# Patient Record
Sex: Male | Born: 1995 | Race: Black or African American | Hispanic: No | Marital: Single | Smoking: Never smoker
Health system: Southern US, Community
[De-identification: ages and names within clinical notes are randomized; demographics above are authoritative.]

## PROBLEM LIST (undated history)

## (undated) HISTORY — PX: ABDOMINAL SURGERY: SHX537

---
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
## 2017-08-16 ENCOUNTER — Other Ambulatory Visit: Payer: Self-pay

## 2017-08-16 ENCOUNTER — Encounter: Payer: Self-pay | Admitting: Emergency Medicine

## 2017-08-16 DIAGNOSIS — S161XXA Strain of muscle, fascia and tendon at neck level, initial encounter: Secondary | ICD-10-CM | POA: Diagnosis not present

## 2017-08-16 DIAGNOSIS — S199XXA Unspecified injury of neck, initial encounter: Secondary | ICD-10-CM | POA: Diagnosis present

## 2017-08-16 DIAGNOSIS — Y9389 Activity, other specified: Secondary | ICD-10-CM | POA: Diagnosis not present

## 2017-08-16 DIAGNOSIS — Y999 Unspecified external cause status: Secondary | ICD-10-CM | POA: Diagnosis not present

## 2017-08-16 DIAGNOSIS — Y92410 Unspecified street and highway as the place of occurrence of the external cause: Secondary | ICD-10-CM | POA: Insufficient documentation
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
## 2017-08-17 ENCOUNTER — Emergency Department
Admission: EM | Admit: 2017-08-17 | Discharge: 2017-08-17 | Disposition: A | Discharge status: Home / Self Care | Payer: 59 | Attending: Emergency Medicine | Admitting: Emergency Medicine

## 2017-08-17 DIAGNOSIS — S161XXA Strain of muscle, fascia and tendon at neck level, initial encounter: Secondary | ICD-10-CM

## 2017-08-17 MED ORDER — CYCLOBENZAPRINE HCL 10 MG PO TABS
5.0000 mg | ORAL_TABLET | Freq: Once | ORAL | Status: AC
  Administered 2017-08-17: 5 mg via ORAL
  Filled 2017-08-17: qty 1

## 2017-08-17 MED ORDER — IBUPROFEN 800 MG PO TABS
800.0000 mg | ORAL_TABLET | Freq: Once | ORAL | Status: AC
  Administered 2017-08-17: 800 mg via ORAL
  Filled 2017-08-17: qty 1

## 2017-08-17 MED ORDER — IBUPROFEN 800 MG PO TABS: 800.0000 mg | ORAL_TABLET | Freq: Three times a day (TID) | ORAL | 0 refills | Status: AC | PRN

## 2017-08-17 MED ORDER — CYCLOBENZAPRINE HCL 5 MG PO TABS: ORAL_TABLET | ORAL | 0 refills | Status: AC
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
## 2017-08-17 NOTE — Discharge Instructions (Addendum)
1.  You may take medicines as needed for pain and muscle spasms (Motrin/Flexeril #15). °2.  Apply moist heat to affected area several times daily. °3.  Return to the ER for worsening symptoms, persistent vomiting, difficulty breathing or other concerns. °

## 2017-09-22 ENCOUNTER — Emergency Department: Payer: 59

## 2017-09-22 ENCOUNTER — Emergency Department
Admission: EM | Admit: 2017-09-22 | Discharge: 2017-09-22 | Disposition: A | Discharge status: Other Acute Inpatient Hospital | Payer: 59 | Attending: Student in an Organized Health Care Education/Training Program | Admitting: Student in an Organized Health Care Education/Training Program

## 2017-09-22 ENCOUNTER — Encounter: Payer: Self-pay | Admitting: Emergency Medicine

## 2017-09-22 DIAGNOSIS — Y939 Activity, unspecified: Secondary | ICD-10-CM | POA: Insufficient documentation

## 2017-09-22 DIAGNOSIS — Y999 Unspecified external cause status: Secondary | ICD-10-CM | POA: Insufficient documentation

## 2017-09-22 DIAGNOSIS — Z23 Encounter for immunization: Secondary | ICD-10-CM | POA: Diagnosis not present

## 2017-09-22 DIAGNOSIS — Y929 Unspecified place or not applicable: Secondary | ICD-10-CM | POA: Diagnosis not present

## 2017-09-22 DIAGNOSIS — W3400XA Accidental discharge from unspecified firearms or gun, initial encounter: Secondary | ICD-10-CM

## 2017-09-22 DIAGNOSIS — S3981XA Other specified injuries of abdomen, initial encounter: Secondary | ICD-10-CM | POA: Diagnosis present

## 2017-09-22 DIAGNOSIS — S31144A Puncture wound of abdominal wall with foreign body, left lower quadrant without penetration into peritoneal cavity, initial encounter: Secondary | ICD-10-CM | POA: Diagnosis not present

## 2017-09-22 DIAGNOSIS — R1032 Left lower quadrant pain: Secondary | ICD-10-CM

## 2017-09-22 MED ORDER — TETANUS-DIPHTH-ACELL PERTUSSIS 5-2.5-18.5 LF-MCG/0.5 IM SUSP
0.5000 mL | Freq: Once | INTRAMUSCULAR | Status: AC
  Administered 2017-09-22: 0.5 mL via INTRAMUSCULAR
  Filled 2017-09-22: qty 0.5

## 2017-09-22 MED ORDER — TRANEXAMIC ACID 1000 MG/10ML IV SOLN: 500.0000 mg | Freq: Once | INTRAVENOUS | Status: DC

## 2017-09-22 MED ORDER — TRANEXAMIC ACID 1000 MG/10ML IV SOLN
1000.0000 mg | INTRAVENOUS | Status: AC
  Administered 2017-09-22: 1000 mg via INTRAVENOUS
  Filled 2017-09-22: qty 10

## 2017-09-22 MED ORDER — TRANEXAMIC ACID 1000 MG/10ML IV SOLN
1000.0000 mg | Freq: Once | INTRAVENOUS | Status: DC
  Filled 2017-09-22: qty 10

## 2017-09-22 MED ORDER — FENTANYL CITRATE (PF) 100 MCG/2ML IJ SOLN
50.0000 ug | INTRAMUSCULAR | Status: DC | PRN
  Administered 2017-09-22: 50 ug via INTRAVENOUS
  Filled 2017-09-22: qty 2

## 2017-09-22 MED ORDER — SODIUM CHLORIDE 0.9 % IV SOLN
10.0000 mL/h | Freq: Once | INTRAVENOUS | Status: AC
  Administered 2017-09-22: 10 mL/h via INTRAVENOUS

## 2017-09-22 NOTE — ED Notes (Signed)
Chaplin at bedside with pt.

## 2017-09-22 NOTE — ED Notes (Signed)
2nd
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
## 2017-09-22 NOTE — ED Notes (Addendum)
Pt alert and talking prior to discharge from hospital. Iv fluids infusing.  PRBC's still infusing.  Pt placed on 2 liters oxygen via [place].  Sinus on monitor.  Bleeding controlled with gauze bandages.
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
## 2017-09-22 NOTE — ED Notes (Signed)
EMTALA REVIEWED 

## 2017-09-22 NOTE — ED Notes (Addendum)
Patient unable to sign for consent for transfer.  Family arrived as patient was being transferred.
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
## 2017-09-22 NOTE — ED Notes (Signed)
[facility].

## 2017-09-22 NOTE — ED Notes (Signed)
PTS CLOTHING REMOVED BY THIS RN AND AMY, RN. NO CUTTING INVOLVED IN REMOVAL. ITEMS REMOVED INCLUDE: 1 BLACK SHIRT, 2 GRAY SOCKS, 1 BLUE WITH RED STRIP SHORT, 1 BLACK UNDERWEAR, 2 BLACK AND WHITE STRIP PAIR OF SHOES. ALL ITEMS PLACED IN BELONGINGS BAG AND PLACED INTO BROWN PAPER BAG.
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
## 2017-09-22 NOTE — ED Notes (Signed)
Patient's clothing given to [employer] officer T. Greggory StallionGeorge.  Pt wearing his a golden colored chain around his neck with a golden color nuggett.

## 2017-09-22 NOTE — ED Notes (Signed)
ED Provider at bedside. 

## 2017-09-23 LAB — BPAM RBC
BLOOD PRODUCT EXPIRATION DATE: 201909102359
Blood Product Expiration Date: 201909102359
ISSUE DATE / TIME: 201908262131
ISSUE DATE / TIME: 201908262131
UNIT TYPE AND RH: 5100
Unit Type and Rh: 5100

## 2017-09-23 LAB — TYPE AND SCREEN
ABO/RH(D): O POS
ANTIBODY SCREEN: NEGATIVE
Unit division: 0
Unit division: 0

## 2017-09-25 LAB — PREPARE RBC (CROSSMATCH)

## 2018-08-06 ENCOUNTER — Other Ambulatory Visit: Payer: Self-pay | Admitting: *Deleted

## 2018-08-06 DIAGNOSIS — Z20822 Contact with and (suspected) exposure to covid-19: Secondary | ICD-10-CM

## 2018-08-11 LAB — NOVEL CORONAVIRUS, NAA: SARS-CoV-2, NAA: NOT DETECTED
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
## 2019-12-02 ENCOUNTER — Ambulatory Visit
Admission: EM | Admit: 2019-12-02 | Discharge: 2019-12-02 | Disposition: A | Discharge status: Home / Self Care | Payer: BC Managed Care – PPO

## 2019-12-02 DIAGNOSIS — S6992XD Unspecified injury of left wrist, hand and finger(s), subsequent encounter: Secondary | ICD-10-CM

## 2019-12-02 NOTE — ED Triage Notes (Signed)
Pt presents with complaints of left pinky finger injury. Reports he crushed it at work 4 months ago. Reports he never had it x-ray'd. He did go see his doctor at that time. The finger is swollen and painful still. Full range of motion in hand and finger.

## 2019-12-02 NOTE — ED Provider Notes (Signed)
[facility]    CSN: [id number] Arrival date & time: 12/02/19  1341      History   Chief Complaint Chief Complaint  Patient presents with  . Finger Injury    HPI Larry Hall is a 24 y.o. male  Presenting for, left pinky deformity.  Had crush injury 4 months ago at work.  Never had x-rayed, though states his "regular doctor gave me swelling".  Denies pain, numbness, weakness, decreased ROM.  History reviewed. No pertinent past medical history.  There are no problems to display for this patient.   Past Surgical History:  Procedure Laterality Date  . ABDOMINAL SURGERY         Home Medications    Prior to Admission medications   Medication Sig Start Date End Date Taking? Authorizing Provider  cyclobenzaprine (FLEXERIL) 5 MG tablet 1 tablet every 8 hours as needed for muscle spasms 08/17/17   Irean Hong, MD  ibuprofen (ADVIL,MOTRIN) 800 MG tablet Take 1 tablet (800 mg total) by mouth every 8 (eight) hours as needed for moderate pain. 08/17/17   Irean Hong, MD    Family History Family History  Problem Relation Age of Onset  . Healthy Mother   . Healthy Father     Social History Social History   Tobacco Use  . Smoking status: Never Smoker  . Smokeless tobacco: Never Used  Substance Use Topics  . Alcohol use: No  . Drug use: Never     Allergies   Patient has no known allergies.   Review of Systems Review of Systems  Constitutional: Negative for fatigue and fever.  Respiratory: Negative for cough and shortness of breath.   Cardiovascular: Negative for chest pain and palpitations.  Musculoskeletal: Positive for joint swelling.  Neurological: Negative for weakness and numbness.     Physical Exam Triage Vital Signs ED Triage Vitals  Enc Vitals Group     BP 12/02/19 1421 119/69     Pulse Rate 12/02/19 1421 (!) 56     Resp 12/02/19 1421 19     Temp 12/02/19 1421 98.3 F (36.8 C)     Temp src --      SpO2 12/02/19 1421 98 %      Weight --      Height --      Head Circumference --      Peak Flow --      Pain Score 12/02/19 1420 7     Pain Loc --      Pain Edu? --      Excl. in GC? --    No data found.  Updated Vital Signs BP 119/69   Pulse (!) 56   Temp 98.3 F (36.8 C)   Resp 19   SpO2 98%   Visual Acuity Right Eye Distance:   Left Eye Distance:   Bilateral Distance:    Right Eye Near:   Left Eye Near:    Bilateral Near:     Physical Exam Constitutional:      General: He is not in acute distress. HENT:     Head: Normocephalic and atraumatic.  Eyes:     General: No scleral icterus.    Pupils: Pupils are equal, round, and reactive to light.  Cardiovascular:     Rate and Rhythm: Normal rate.  Pulmonary:     Effort: Pulmonary effort is normal. No respiratory distress.     Breath sounds: No wheezing.  Musculoskeletal:  General: Deformity present. No swelling. Normal range of motion.     Comments: Slight flexion left fifth digit, DIP.  NVI  Skin:    Capillary Refill: Capillary refill takes less than 2 seconds.     Coloration: Skin is not jaundiced or pale.  Neurological:     General: No focal deficit present.     Mental Status: He is alert and oriented to person, place, and time.      UC Treatments / Results  Labs (all labs ordered are listed, but only abnormal results are displayed) Labs Reviewed - No data to display  EKG   Radiology No results found.  Procedures Procedures (including critical care time)  Medications Ordered in UC Medications - No data to display  Initial Impression / Assessment and Plan / UC Course  I have reviewed the triage vital signs and the nursing notes.  Pertinent labs & imaging results that were available during my care of the patient were reviewed by me and considered in my medical decision making (see chart for details).     Injury occurred 4 months ago, reviewed that wound has already healed.  Will defer x-ray at this time, provided  Ortho contact information should patient want to pursue imaging, deformity revision as this is not currently impacting QOL.  Return precautions discussed, pt verbalized understanding and is agreeable to plan. Final Clinical Impressions(s) / UC Diagnoses   Final diagnoses:  Finger injury, left, subsequent encounter   Discharge Instructions   None    ED Prescriptions    None     PDMP not reviewed this encounter.   Hall-Potvin, [place], [place] 12/02/19 1439

## 2021-02-27 ENCOUNTER — Ambulatory Visit: Payer: BC Managed Care – PPO

## 2023-06-15 ENCOUNTER — Emergency Department (HOSPITAL_BASED_OUTPATIENT_CLINIC_OR_DEPARTMENT_OTHER)
Admission: EM | Admit: 2023-06-15 | Discharge: 2023-06-16 | Disposition: A | Discharge status: Home / Self Care | Payer: Self-pay | Attending: Emergency Medicine | Admitting: Emergency Medicine

## 2023-06-15 ENCOUNTER — Other Ambulatory Visit: Payer: Self-pay

## 2023-06-15 ENCOUNTER — Encounter (HOSPITAL_BASED_OUTPATIENT_CLINIC_OR_DEPARTMENT_OTHER): Payer: Self-pay | Admitting: Emergency Medicine

## 2023-06-15 DIAGNOSIS — N4889 Other specified disorders of penis: Secondary | ICD-10-CM | POA: Insufficient documentation

## 2023-06-15 DIAGNOSIS — S39840A Fracture of corpus cavernosum penis, initial encounter: Secondary | ICD-10-CM

## 2023-06-15 DIAGNOSIS — X501XXA Overexertion from prolonged static or awkward postures, initial encounter: Secondary | ICD-10-CM | POA: Diagnosis not present

## 2023-06-15 NOTE — ED Triage Notes (Signed)
 Pt reports he was having sex Friday night & he hit his penis the wrong way & it has been stuck sideway since then. Denies issues urinating.

## 2023-06-16 NOTE — ED Provider Notes (Signed)
  [facility] Provider Note   CSN: [id number] Arrival date & time: 06/15/23  2235     History  Chief Complaint  Patient presents with   Groin Pain    Larry Hall is a 28 y.o. male.  Patient is a 28 year old male presenting with complaints of penis pain.  He states he was having sex 2 nights ago and his penis bent awkwardly.  He experienced pain at the time and has been having discomfort and angulation of his penis during erections.  He denies any difficulty urinating.  He is concerned he may have a penile fracture.       Home Medications Prior to Admission medications   Medication Sig Start Date End Date Taking? Authorizing Provider  cyclobenzaprine  (FLEXERIL ) 5 MG tablet 1 tablet every 8 hours as needed for muscle spasms 08/17/17   Sung, Jade J, MD  ibuprofen  (ADVIL ,MOTRIN ) 800 MG tablet Take 1 tablet (800 mg total) by mouth every 8 (eight) hours as needed for moderate pain. 08/17/17   Sung, Jade J, MD      Allergies    Patient has no known allergies.    Review of Systems   Review of Systems  All other systems reviewed and are negative.   Physical Exam Updated Vital Signs BP 139/65   Pulse 65   Temp 98.2 F (36.8 C) (Oral)   Resp 18   SpO2 100%  Physical Exam Vitals and nursing note reviewed.  Constitutional:      Appearance: Normal appearance.  HENT:     Head: Normocephalic.  Pulmonary:     Effort: Pulmonary effort is normal.  Genitourinary:    Penis: Normal.      Comments: To my exam, the penis is basically normal in appearance.  There is no blood at the meatus. Skin:    General: Skin is warm and dry.  Neurological:     Mental Status: He is alert and oriented to person, place, and time.     ED Results / Procedures / Treatments   Labs (all labs ordered are listed, but only abnormal results are displayed) Labs Reviewed - No data to display  EKG None  Radiology No results  found.  Procedures Procedures    Medications Ordered in ED Medications - No data to display  ED Course/ Medical Decision Making/ A&P  Patient presenting with complaints of penile discomfort as described in the HPI.  This resulted from an injury during sexual activity 2 nights ago.  He is able to urinate and his penis is basically normal on exam.  I did discuss care with Dr. Derrick Fling from urology.  He is advising the patient follow-up in the next week and refrain from sexual activity for the next several weeks.  Final Clinical Impression(s) / ED Diagnoses Final diagnoses:  None    Rx / DC Orders ED Discharge Orders     None         Orvilla Blander, MD 06/16/23 [phone]

## 2023-06-16 NOTE — Discharge Instructions (Signed)
 No sexual activity until cleared by urology.  Follow-up with urology in the next week.  The contact information for [facility] has been provided in this discharge summary for you to call and make these arrangements.
